# Patient Record
Sex: Female | Born: 1980 | Race: Black or African American | Hispanic: No | Marital: Single | Smoking: Never smoker
Health system: Southern US, Community
[De-identification: ages and names within clinical notes are randomized; demographics above are authoritative.]

## PROBLEM LIST (undated history)

## (undated) DIAGNOSIS — E079 Disorder of thyroid, unspecified: Secondary | ICD-10-CM

## (undated) HISTORY — PX: APPENDECTOMY: SHX54

## (undated) HISTORY — PX: BREAST SURGERY: SHX581

## (undated) HISTORY — PX: TONSILLECTOMY: SUR1361

## (undated) HISTORY — PX: CHOLECYSTECTOMY: SHX55

---

## 2006-05-30 ENCOUNTER — Encounter: Admission: RE | Admit: 2006-05-30 | Discharge: 2006-05-30 | Payer: Self-pay | Admitting: Occupational Medicine

## 2013-10-07 ENCOUNTER — Other Ambulatory Visit: Payer: Self-pay | Admitting: Endocrinology

## 2013-10-07 DIAGNOSIS — E041 Nontoxic single thyroid nodule: Secondary | ICD-10-CM

## 2013-10-14 ENCOUNTER — Ambulatory Visit
Admission: RE | Admit: 2013-10-14 | Discharge: 2013-10-14 | Disposition: A | Discharge status: Home / Self Care | Payer: Medicaid Other | Source: Ambulatory Visit | Attending: Endocrinology | Admitting: Endocrinology

## 2013-10-14 ENCOUNTER — Other Ambulatory Visit (HOSPITAL_COMMUNITY)
Admission: RE | Admit: 2013-10-14 | Discharge: 2013-10-14 | Disposition: A | Discharge status: Home / Self Care | Payer: Medicaid Other | Source: Ambulatory Visit | Attending: Interventional Radiology | Admitting: Interventional Radiology

## 2013-10-14 DIAGNOSIS — E041 Nontoxic single thyroid nodule: Secondary | ICD-10-CM

## 2014-01-21 ENCOUNTER — Emergency Department
Admission: EM | Admit: 2014-01-21 | Discharge: 2014-01-21 | Disposition: A | Discharge status: Home / Self Care | Payer: Medicaid Other | Source: Home / Self Care | Attending: Family Medicine | Admitting: Family Medicine

## 2014-01-21 ENCOUNTER — Encounter: Payer: Self-pay | Admitting: Emergency Medicine

## 2014-01-21 DIAGNOSIS — R300 Dysuria: Secondary | ICD-10-CM

## 2014-01-21 LAB — POCT URINALYSIS DIP (MANUAL ENTRY)
Bilirubin, UA: NEGATIVE
GLUCOSE UA: NEGATIVE
Ketones, POC UA: NEGATIVE
Leukocytes, UA: NEGATIVE
NITRITE UA: NEGATIVE
Protein Ur, POC: NEGATIVE
RBC UA: NEGATIVE
Spec Grav, UA: 1.025 (ref 1.005–1.03)
UROBILINOGEN UA: 0.2 (ref 0–1)
pH, UA: 7 (ref 5–8)

## 2014-01-21 MED ORDER — CEPHALEXIN 500 MG PO CAPS: 500.0000 mg | ORAL_CAPSULE | Freq: Three times a day (TID) | ORAL | Status: DC

## 2014-01-21 MED ORDER — FLUCONAZOLE 150 MG PO TABS: 150.0000 mg | ORAL_TABLET | Freq: Once | ORAL | Status: DC

## 2014-01-21 NOTE — ED Provider Notes (Signed)
CSN: [id number]     Arrival date & time 01/21/14  1411 History   None    Chief Complaint  Patient presents with  . Dysuria      HPI DYSURIA Onset:  1-2 days  Description: increased urinary frequency, bladder pressure  Modifying factors: pt states that she holds her urine at night.   Symptoms Urgency:  yes Frequency: yes  Hesitancy:  yes Hematuria:  no Flank Pain:  no Fever: no Nausea/Vomiting:  no Missed LMP: no STD exposure: no Discharge: no Irritants: no Rash: no  Red Flags   More than 3 UTI's last 12 months:  no PMH of  Diabetes or Immunosuppression:  no Renal Disease/Calculi: no Urinary Tract Abnormality:  no Instrumentation or Trauma: no      History reviewed. No pertinent past medical history. Past Surgical History  Procedure Laterality Date  . Cholecystectomy    . Tonsillectomy    . Appendectomy    . Cesarean section     Family History  Problem Relation Age of Onset  . Diabetes Mother   . Hypertension Mother   . Diabetes Father   . Hypertension Father   . Heart disease Father    History  Substance Use Topics  . Smoking status: Never Smoker   . Smokeless tobacco: Not on file  . Alcohol Use: No   OB History   Grav Para Term Preterm Abortions TAB SAB Ect Mult Living                 Review of Systems  All other systems reviewed and are negative.      Allergies  Review of patient's allergies indicates not on file.  Home Medications   Current Outpatient Rx  Name  Route  Sig  Dispense  Refill  . norethindrone-ethinyl estradiol (JUNEL FE,GILDESS FE,LOESTRIN FE) 1-20 MG-MCG tablet   Oral   Take 1 tablet by mouth daily.         . cephALEXin (KEFLEX) 500 MG capsule   Oral   Take 1 capsule (500 mg total) by mouth 3 (three) times daily.   21 capsule   0   . fluconazole (DIFLUCAN) 150 MG tablet   Oral   Take 1 tablet (150 mg total) by mouth once. Repeat if needed   2 tablet   0    BP 113/79  Pulse 66  Temp(Src) 97.8 F  (36.6 C) (Oral)  Ht 5\' 9"  (1.753 m)  Wt 229 lb (103.874 kg)  BMI 33.80 kg/m2  SpO2 99% Physical Exam  Constitutional: She is oriented to person, place, and time. She appears well-developed and well-nourished.  HENT:  Head: Normocephalic and atraumatic.  Eyes: Conjunctivae are normal. Pupils are equal, round, and reactive to light.  Neck: Normal range of motion. Neck supple.  Cardiovascular: Normal rate and regular rhythm.   Pulmonary/Chest: Effort normal.  Abdominal: Soft. Bowel sounds are normal.  No flank pain  Minimal suprapubic tenderness    Musculoskeletal: Normal range of motion.  Neurological: She is alert and oriented to person, place, and time.  Skin: Skin is warm.    ED Course  Procedures (including critical care time) Labs Review Labs Reviewed  URINE CULTURE  POCT URINALYSIS DIP (MANUAL ENTRY)   Imaging Review No results found.    MDM   Final diagnoses:  Dysuria   History consistent with UTI  Will place on keflex.  Urine culture.  Discussed not holding urine.  Discussed GU red flags.  Follow up as  needed.     The patient and/or caregiver has been counseled thoroughly with regard to treatment plan and/or medications prescribed including dosage, schedule, interactions, rationale for use, and possible side effects and they verbalize understanding. Diagnoses and expected course of recovery discussed and will return if not improved as expected or if the condition worsens. Patient and/or caregiver verbalized understanding.         Doree AlbeeSteven Jakyle Petrucelli, MD 01/21/14 1444

## 2014-01-21 NOTE — ED Notes (Signed)
Dysuria since this morning

## 2014-01-22 LAB — URINE CULTURE
Colony Count: NO GROWTH
ORGANISM ID, BACTERIA: NO GROWTH

## 2014-01-23 ENCOUNTER — Telehealth: Payer: Self-pay | Admitting: Emergency Medicine

## 2014-06-12 IMAGING — US US THYROID BIOPSY
1 series · 13 of 25 positions shown · non-contrast
Comparison: Prior thyroid ultrasound 10/05/2013; prior nuclear
medicine thyroid scan 09/08/2008

CLINICAL DATA: 32-year-old female with thyroid nodule meeting
consensus criteria for ultrasound-guided fine needle aspiration
biopsy.

EXAM:
ULTRASOUND GUIDED NEEDLE ASPIRATE BIOPSY OF THE THYROID GLAND

[Series 1: us thyroid biopsy · 0.08mm/px · 37 acquisitions, 13 frames shown]
[im 1/37]
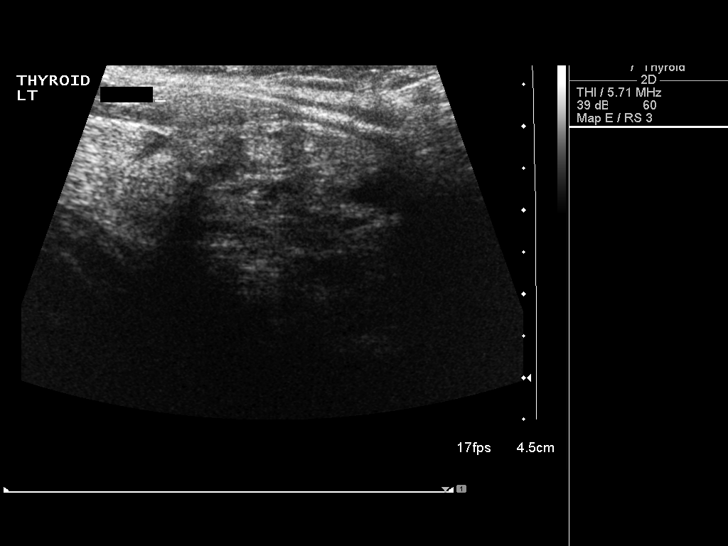
[im 4/37]
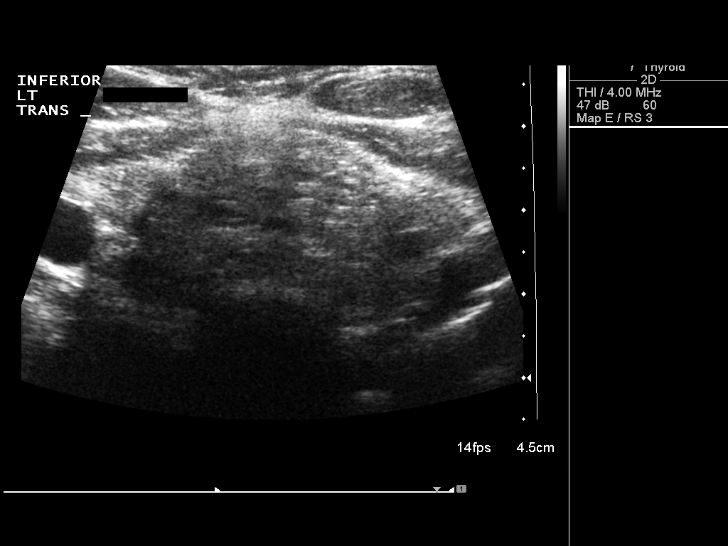
[im 7/37]
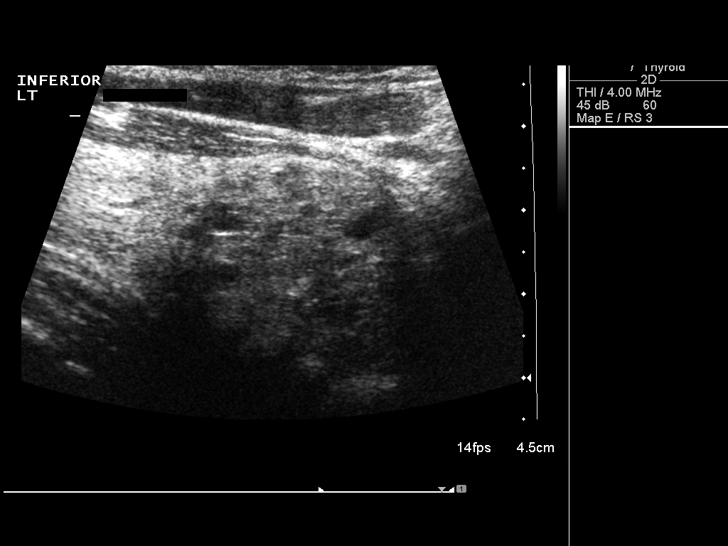
[im 10/37]
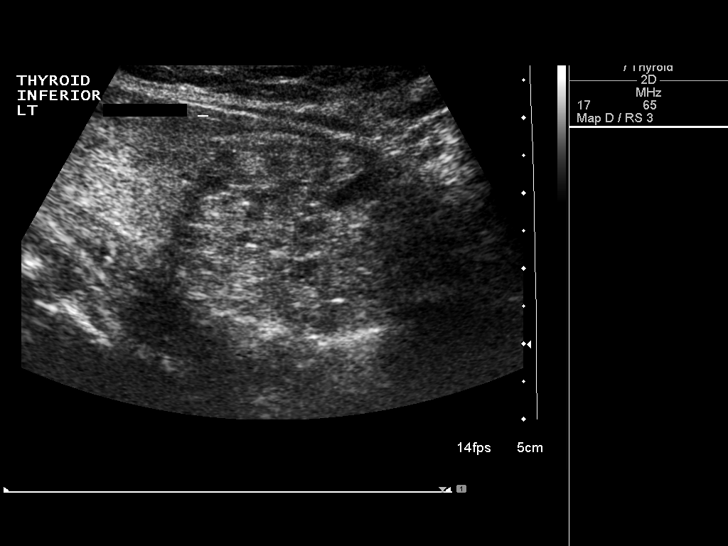
[im 13/37]
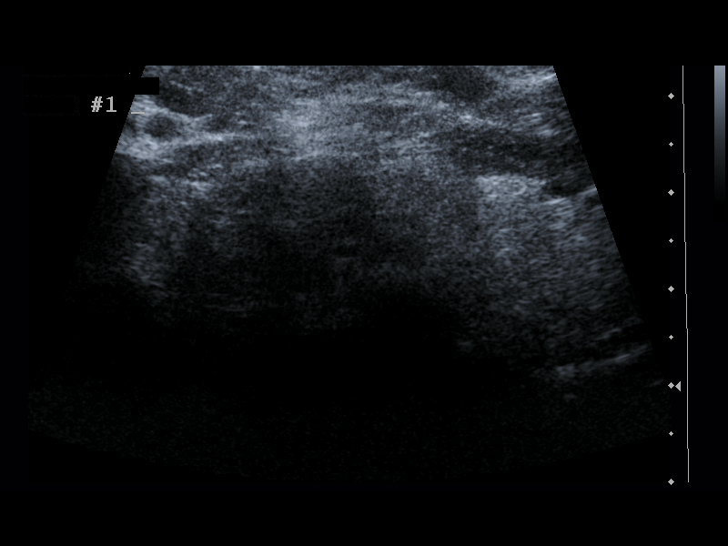
[im 16/37]
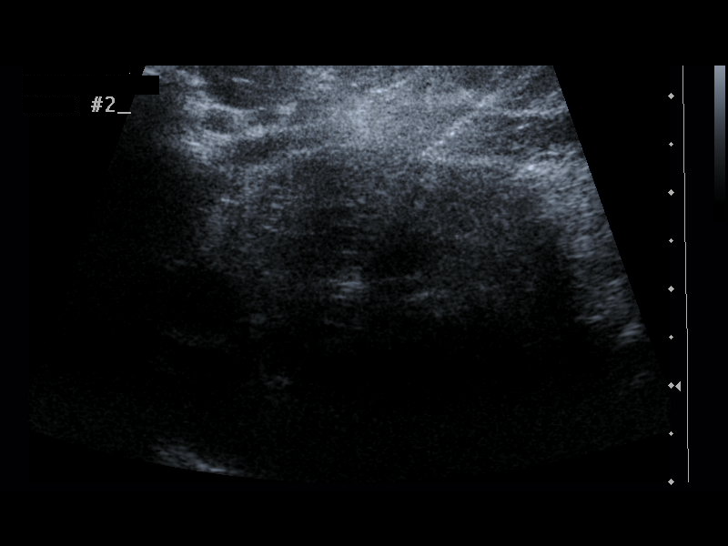
[im 19/37]
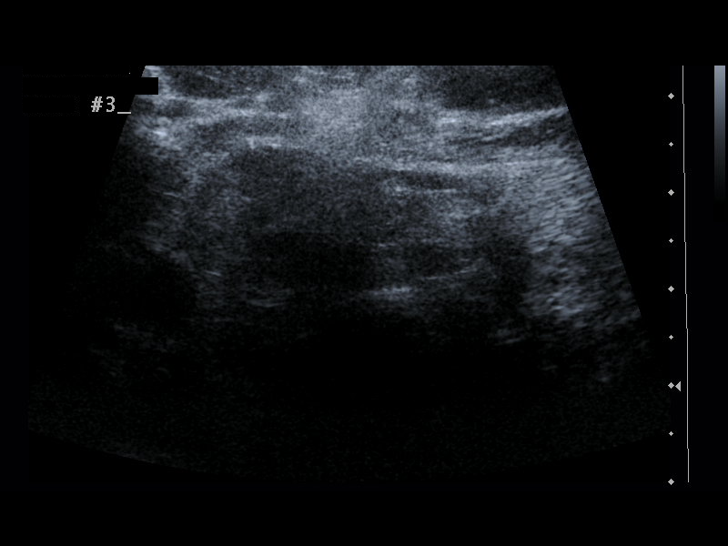
[im 22/37]
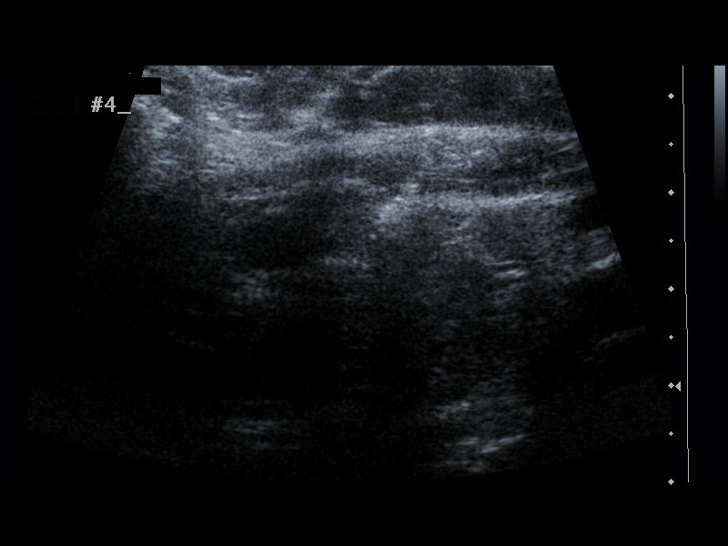
[im 25/37]
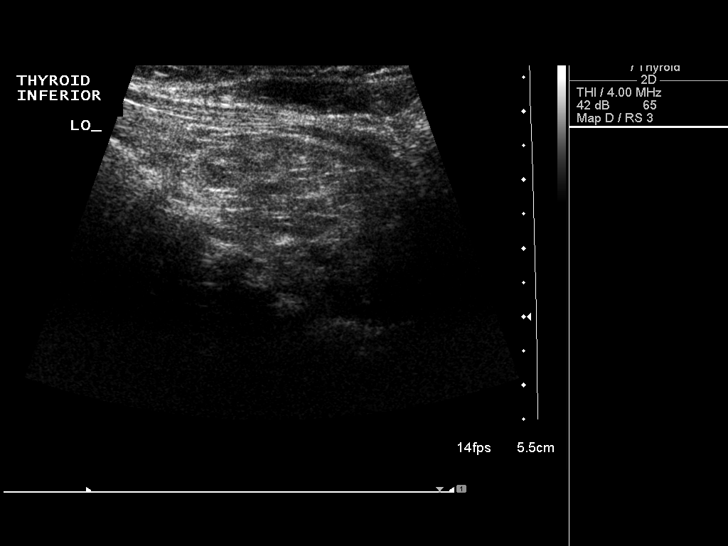
[im 28/37]
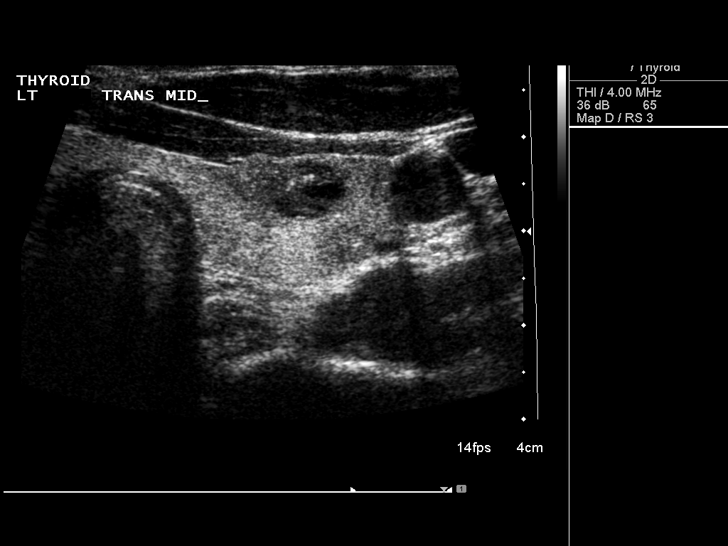
[im 31/37]
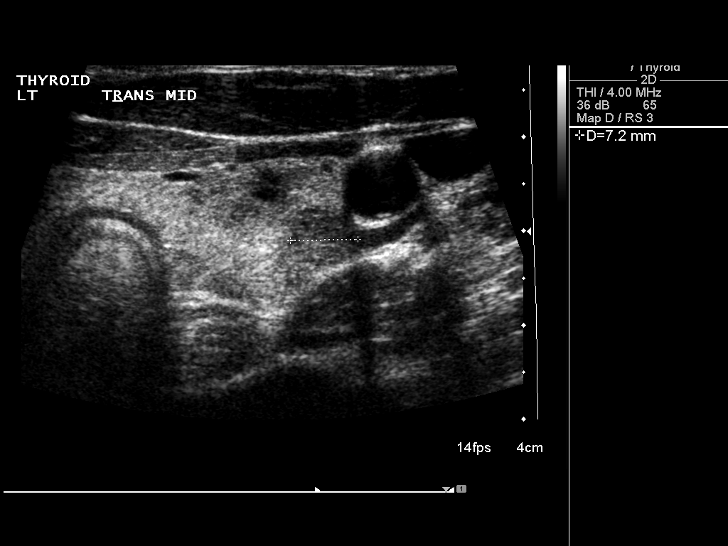
[im 34/37]
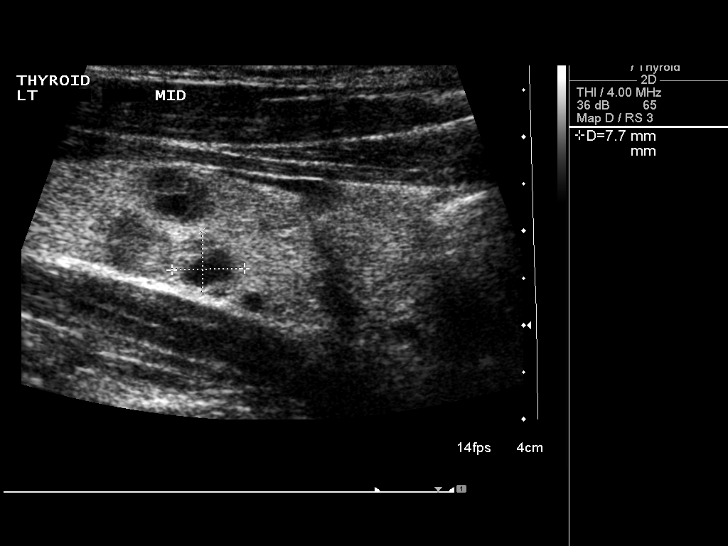
[im 37/37]
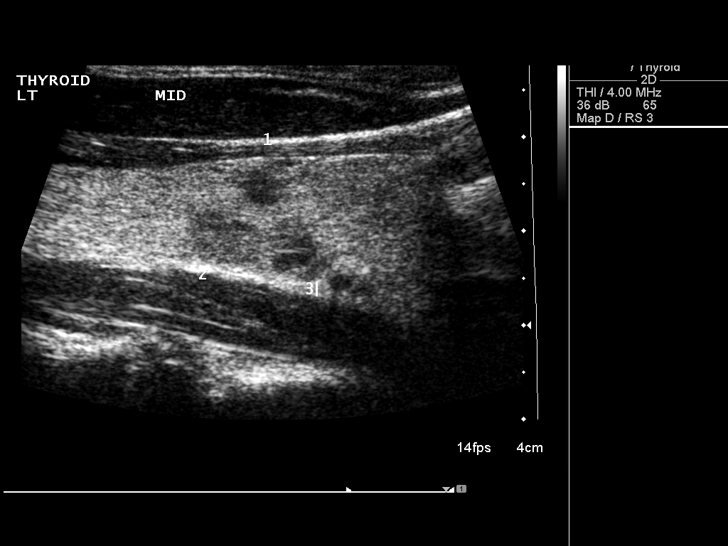

[13 of 25 positions shown; findings below may reference images not displayed]

FINDINGS: The previously measured 1.5 cm ovoid nodule in the left thyroid
gland actually represents 2 adjacent sub cm thyroid nodules which do
not currently meet consensus criteria for ultrasound-guided biopsy.
However, there is a large 3.6 x 2.6 x 4.3 cm predominantly solid
mass projecting exophytically from the inferomedial aspect of the
isthmus.
IMPRESSION: 1. The previously measured 1.5 cm ovoid nodule in the left thyroid
gland actually represents 2 adjacent sub cm thyroid nodules which do
not currently meet consensus criteria for ultrasound-guided biopsy.
However, there is a large 3.6 x 2.6 x 4.3 cm predominantly solid
mass projecting exophytically from the inferomedial aspect of the
isthmus.
2. Ultrasound-guided fine needle aspiration biopsy of 4.3 cm mass
exophytic from the inferior and medial aspect of the thyroid
isthmus.

PROCEDURE:
Thyroid biopsy was thoroughly discussed with the patient and
questions were answered. The benefits, risks, alternatives, and
complications were also discussed. The patient understands and
wishes to proceed with the procedure. Written consent was obtained.

Initial ultrasound interrogation of the left thyroid lobe
demonstrates what appears to be 3 small, separate thyroid nodules.
The lesion which was measured at 1.5 cm consistent with an enlarging
nodule on the prior thyroid scan actually represents 2 small
adjacent subcentimeter nodules. None of these nodules individually
meet consensus criteria for sonographic guided fine needle
aspiration biopsy. However, during interrogation of the left thyroid
gland, a large 3.6 x 2.6 x 4.3 cm predominantly solid mass was
identified projecting exophytically from the inferomedial aspect of
the thyroid isthmus. This nodule/ mass does meet consensus criteria
for biopsy.

Ultrasound was performed to localize and mark an adequate site for
the biopsy. The patient was then prepped and draped in a normal
sterile fashion. Local anesthesia was provided with 1% lidocaine.
Using direct ultrasound guidance, 4 passes were made using needles
into the nodule within the inferomedial isthmus of the thyroid.
Ultrasound was used to confirm needle placements on all occasions.
Specimens were sent to Pathology for analysis.

Complications:  None

## 2015-09-27 ENCOUNTER — Emergency Department
Admission: EM | Admit: 2015-09-27 | Discharge: 2015-09-27 | Disposition: A | Discharge status: Home / Self Care | Payer: Medicaid Other | Source: Home / Self Care | Attending: Family Medicine | Admitting: Family Medicine

## 2015-09-27 ENCOUNTER — Encounter: Payer: Self-pay | Admitting: *Deleted

## 2015-09-27 DIAGNOSIS — J040 Acute laryngitis: Secondary | ICD-10-CM

## 2015-09-27 DIAGNOSIS — J069 Acute upper respiratory infection, unspecified: Secondary | ICD-10-CM

## 2015-09-27 HISTORY — DX: Disorder of thyroid, unspecified: E07.9

## 2015-09-27 LAB — POCT RAPID STREP A (OFFICE): Rapid Strep A Screen: NEGATIVE

## 2015-09-27 MED ORDER — BENZONATATE 100 MG PO CAPS: 100.0000 mg | ORAL_CAPSULE | Freq: Three times a day (TID) | ORAL | Status: DC

## 2015-09-27 MED ORDER — SALINE SPRAY 0.65 % NA SOLN: 1.0000 | NASAL | Status: DC | PRN

## 2015-09-27 MED ORDER — HYDROCODONE-HOMATROPINE 5-1.5 MG/5ML PO SYRP: 5.0000 mL | ORAL_SOLUTION | Freq: Four times a day (QID) | ORAL | Status: DC | PRN

## 2015-09-27 MED ORDER — PSEUDOEPHEDRINE HCL 60 MG PO TABS: 60.0000 mg | ORAL_TABLET | Freq: Four times a day (QID) | ORAL | Status: DC | PRN

## 2015-09-27 NOTE — Discharge Instructions (Signed)
Hycodan is a narcotic pain and cough medication. While taking, do not drink alcohol, drive, or perform any other activities that requires focus while taking these medications.   You may take 400-600mg  Ibuprofen (Motrin) every 6-8 hours for fever and pain  Alternate with Tylenol  You may take 500mg  Tylenol every 4-6 hours as needed for fever and pain   Follow-up with your primary care provider in 7-10 days for recheck of symptoms if not improving.  Be sure to drink plenty of fluids and rest, at least 8hrs of sleep a night, preferably more while you are sick. Return urgent care or go to closest ER if you cannot keep down fluids/signs of dehydration, fever not reducing with Tylenol, difficulty breathing/wheezing, stiff neck, worsening condition, or other concerns (see below)

## 2015-09-27 NOTE — ED Notes (Signed)
Pt c/o cough, hoarseness, HA and runny nose x 1 day. Took Claritin and Mucinex.

## 2015-09-27 NOTE — ED Provider Notes (Signed)
CSN: [id number]     Arrival date & time 09/27/15  1224 History   First MD Initiated Contact with Patient 09/27/15 1236     Chief Complaint  Patient presents with  . Cough  . Hoarse   (Consider location/radiation/quality/duration/timing/severity/associated sxs/prior Treatment) HPI  Pt is a 34yo female presenting to [facility] with c/o dry intermittent mild to moderate cough, sore throat that is "scratchy" 9/10 at worst, rhinirrhea and generalized headache that started yesterday.  Pt took Claritin and Mucinex with no relief. States her son was sick this past weekend but is better now. Denies fever, chills, n/v/d. Denies recent travel. No hx of asthma. Pt requesting a flu test.   Past Medical History  Diagnosis Date  . Thyroid disease    Past Surgical History  Procedure Laterality Date  . Cholecystectomy    . Tonsillectomy    . Appendectomy    . Cesarean section    . Breast surgery     Family History  Problem Relation Age of Onset  . Diabetes Mother   . Hypertension Mother   . Diabetes Father   . Hypertension Father   . Heart disease Father   . Heart failure Father    Social History  Substance Use Topics  . Smoking status: Never Smoker   . Smokeless tobacco: None  . Alcohol Use: No   OB History    No data available     Review of Systems  Constitutional: Negative for fever and chills.  HENT: Positive for rhinorrhea, sore throat and voice change ( hoarse). Negative for congestion, ear pain and trouble swallowing.   Respiratory: Positive for cough. Negative for shortness of breath.   Cardiovascular: Negative for chest pain and palpitations.  Gastrointestinal: Negative for nausea, vomiting, abdominal pain and diarrhea.  Musculoskeletal: Negative for myalgias, back pain and arthralgias.  Skin: Negative for rash.  Neurological: Positive for headaches.  All other systems reviewed and are negative.   Allergies  Review of patient's allergies indicates no known allergies.  Home  Medications   Prior to Admission medications   Medication Sig Start Date End Date Taking? Authorizing Provider  benzonatate (TESSALON) 100 MG capsule Take 1 capsule (100 mg total) by mouth every 8 (eight) hours. 09/27/15   Junius FinnerErin O'Malley, PA-C  HYDROcodone-homatropine (HYCODAN) 5-1.5 MG/5ML syrup Take 5 mLs by mouth every 6 (six) hours as needed for cough. 09/27/15   Junius FinnerErin O'Malley, PA-C  norethindrone-ethinyl estradiol (JUNEL FE,GILDESS FE,LOESTRIN FE) 1-20 MG-MCG tablet Take 1 tablet by mouth daily.    Historical Provider, MD  pseudoephedrine (SUDAFED) 60 MG tablet Take 1 tablet (60 mg total) by mouth every 6 (six) hours as needed for congestion. 09/27/15   Junius FinnerErin O'Malley, PA-C  sodium chloride (OCEAN) 0.65 % SOLN nasal spray Place 1 spray into both nostrils as needed. 09/27/15   Junius FinnerErin O'Malley, PA-C   Meds Ordered and Administered this Visit  Medications - No data to display  BP 134/80 mmHg  Pulse 80  Temp(Src) 99.6 F (37.6 C) (Oral)  Resp 18  Ht 5' 9.5" (1.765 m)  Wt 258 lb (117.028 kg)  BMI 37.57 kg/m2  SpO2 100%  LMP 09/16/2015 No data found.   Physical Exam  Constitutional: She appears well-developed and well-nourished. No distress.  HENT:  Head: Normocephalic and atraumatic.  Right Ear: External ear normal.  Left Ear: External ear normal.  Nose: Nose normal.  Mouth/Throat: Uvula is midline and mucous membranes are normal. Posterior oropharyngeal edema ( mild) and posterior oropharyngeal erythema  present. No oropharyngeal exudate or tonsillar abscesses.  Eyes: Conjunctivae are normal. No scleral icterus.  Neck: Normal range of motion. Neck supple.  Hoarse voice, no stridor  Cardiovascular: Normal rate, regular rhythm and normal heart sounds.   Pulmonary/Chest: Effort normal and breath sounds normal. No respiratory distress. She has no wheezes. She has no rales. She exhibits no tenderness.  Abdominal: Soft. She exhibits no distension. There is no tenderness.   Musculoskeletal: Normal range of motion.  Neurological: She is alert.  Skin: Skin is warm and dry. She is not diaphoretic.  Nursing note and vitals reviewed.   ED Course  Procedures (including critical care time)  Labs Review Labs Reviewed  POCT RAPID STREP A (OFFICE)  POCT INFLUENZA A/B    Imaging Review No results found.     MDM   1. Acute upper respiratory infection   2. Laryngitis    Pt c/o cough, sore throat, and hoarse voice for 24 hours.   Rapid strep and Flu: negative Symptoms likely viral in nature. Rx: hycodan, tessalon, sudafed, and saline nasal spray. Advised pt to use acetaminophen and ibuprofen as needed for fever and pain. Encouraged rest and fluids. F/u with PCP in 7-10 days if not improving, sooner if worsening. Pt verbalized understanding and agreement with tx plan.     Junius Finner, PA-C 09/27/15 1312

## 2015-09-30 LAB — POCT INFLUENZA A/B
Influenza A, POC: NEGATIVE
Influenza B, POC: NEGATIVE

## 2015-10-01 ENCOUNTER — Telehealth: Payer: Self-pay | Admitting: Emergency Medicine

## 2016-11-19 ENCOUNTER — Ambulatory Visit (INDEPENDENT_AMBULATORY_CARE_PROVIDER_SITE_OTHER): Payer: Self-pay | Admitting: Orthopaedic Surgery

## 2016-11-28 ENCOUNTER — Ambulatory Visit (INDEPENDENT_AMBULATORY_CARE_PROVIDER_SITE_OTHER): Payer: Self-pay | Admitting: Orthopedic Surgery

## 2016-12-05 ENCOUNTER — Ambulatory Visit (INDEPENDENT_AMBULATORY_CARE_PROVIDER_SITE_OTHER): Payer: Self-pay | Admitting: Orthopedic Surgery

## 2017-08-27 ENCOUNTER — Ambulatory Visit (HOSPITAL_COMMUNITY): Payer: Medicaid Other | Admitting: Psychiatry

## 2019-02-23 ENCOUNTER — Other Ambulatory Visit: Payer: Self-pay

## 2019-02-23 ENCOUNTER — Encounter (INDEPENDENT_AMBULATORY_CARE_PROVIDER_SITE_OTHER): Payer: Self-pay | Admitting: Orthopaedic Surgery

## 2019-02-23 ENCOUNTER — Ambulatory Visit (INDEPENDENT_AMBULATORY_CARE_PROVIDER_SITE_OTHER): Payer: Self-pay | Admitting: Orthopaedic Surgery

## 2019-02-23 ENCOUNTER — Ambulatory Visit (INDEPENDENT_AMBULATORY_CARE_PROVIDER_SITE_OTHER): Payer: BC Managed Care – PPO

## 2019-02-23 VITALS — BP 120/81 | HR 76 | Ht 69.0 in | Wt 240.0 lb

## 2019-02-23 DIAGNOSIS — M25552 Pain in left hip: Secondary | ICD-10-CM

## 2019-02-23 DIAGNOSIS — G8929 Other chronic pain: Secondary | ICD-10-CM | POA: Insufficient documentation

## 2019-02-23 DIAGNOSIS — M5442 Lumbago with sciatica, left side: Secondary | ICD-10-CM | POA: Diagnosis not present
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
## 2019-02-23 NOTE — Addendum Note (Signed)
Addended by: Wendi Maya on: 02/23/2019 12:59 PM   Modules accepted: Orders

## 2019-03-05 ENCOUNTER — Telehealth (INDEPENDENT_AMBULATORY_CARE_PROVIDER_SITE_OTHER): Payer: Self-pay | Admitting: Physical Medicine and Rehabilitation

## 2019-03-05 NOTE — Telephone Encounter (Signed)
Scheduled for Webex visit on 3/31 at 0845. Patient advised to download app.

## 2019-03-05 NOTE — Telephone Encounter (Signed)
Please tell her that I reviewed Dr. Hoy Register notes extensively and he had detailed her MRI report and that her symptoms have been going on chronically for 5 years.Please tell her that we are limiting appointments to folks that have severe problems that cannot be pushed off 2 to 3 weeks.  If she does not want a virtual visit then I am fine seeing her at a later date.  Alternatively we could schedule her for facet joint block which was suggested by Dr. Cleophas Dunker based on his opinion looking at the images and the fact that epidural injection performed by another physician was not beneficial and seem to make the symptoms worse.

## 2019-03-05 NOTE — Telephone Encounter (Signed)
Okay; thanks.

## 2019-03-10 ENCOUNTER — Ambulatory Visit (INDEPENDENT_AMBULATORY_CARE_PROVIDER_SITE_OTHER): Payer: BC Managed Care – PPO | Admitting: Physical Medicine and Rehabilitation

## 2019-03-10 ENCOUNTER — Encounter (INDEPENDENT_AMBULATORY_CARE_PROVIDER_SITE_OTHER): Payer: Self-pay | Admitting: Physical Medicine and Rehabilitation

## 2019-03-10 DIAGNOSIS — M5442 Lumbago with sciatica, left side: Secondary | ICD-10-CM

## 2019-03-10 DIAGNOSIS — M47816 Spondylosis without myelopathy or radiculopathy, lumbar region: Secondary | ICD-10-CM

## 2019-03-10 DIAGNOSIS — M5116 Intervertebral disc disorders with radiculopathy, lumbar region: Secondary | ICD-10-CM | POA: Diagnosis not present

## 2019-03-10 DIAGNOSIS — G8929 Other chronic pain: Secondary | ICD-10-CM

## 2019-03-10 NOTE — Progress Notes (Signed)
 .  Numeric Pain Rating Scale and Functional Assessment Average Pain 7   In the last MONTH (on 0-10 scale) has pain interfered with the following?  1. General activity like being  able to carry out your everyday physical activities such as walking, climbing stairs, carrying groceries, or moving a chair?  Rating(5)   

## 2019-03-10 NOTE — Progress Notes (Signed)
Virtual Visit via Video Note  I connected with Amanda Mosley on 03/10/19 at  8:45 AM EDT by a video enabled telemedicine application and verified that I am speaking with the correct person using two identifiers.   I discussed the limitations of evaluation and management by telemedicine and the availability of in person appointments. The patient expressed understanding and agreed to proceed.  History of Present Illness: The patient is a very pleasant 38 year old female who we saw today at the request of Dr. Norlene Campbell for another opinion on her back and lumbar spine and left hip and buttock issues.  Evidently I have seen her father in the past it helped quite a bit with an injection.  I did review 40 pages of notes from [facility] and Dr. Burt Knack as well as report of lumbar spine MRI from October 2019 and Dr. Kary Kos interpretation of the MRI.  We also obviously reviewed Dr. Hoy Register notes as well.  Patient tells me she has had ongoing pain for probably 4 years of back pain that she has worked through with exercises and therapy in the past.  Some medication management is helped.  She would have intermittent flareups.  She never had an MRI at that time.  She was seen in the [facility] spine center in the past for back pain.  She felt like at that time a lot of her back pain could have been related to having to children and epidural performed during the C-section.  She reports though that in early October without any specific incident she had increasing left buttock pain with referral down the posterior lateral back of the thigh to about the knee.  She does not really endorse much past the knee.  She did get some paresthesia in the left foot which was more of the lateral foot but that was only on occasion and nothing persistent.  She reported that really this pain was much more significant than the back pain.  She had no pain on the right side.  She was seen in an urgent care  and they did do a urgent MRI of the lumbar spine with suspected acute herniated disc.  She did get worsening symptoms with prolonged sitting more than standing and walking.  She reports being able to workout at the gym without much difficulty.  She does report though that lifting seems to increase the pain as does any sort of coughing or sneezing.  She has not noticed any focal weakness or foot drop.  Physical exam performed by both Dr. Val Eagle and Dr. Cleophas Dunker did not show any strength loss.  She ultimately had to epidural injections performed by Dr. Marcy Siren.  The patient thinks these were done with ultrasound.  I did discuss that I think it was probably done with fluoroscopy but we do not have those notes for review.  She does state that she did not feel any pain pattern with the injection itself down in the hip or leg.  Dr. Val Eagle suggested that since the injections did not help that he would recommend an L5-S1 microdiscectomy.  He placed her on Lyrica 75 mg twice per day.  She states that the last visit with him she was basically in tears because she did not want surgery and she felt like the Lyrica had been given to her just to get her through to surgery.  She had tried some hydrocodone which was not beneficial.  She reported gabapentin seem to be helping but  she did not like the side effect profile which she felt like gave her uncontrolled movements.  I did discuss with her that have not seen that was gabapentin but it sounds like maybe she titrated up the dose pretty quickly.  It looks like they had her upping the dose every 3 days to get to 300 mg 3 times a day which seems to be a little bit aggressive.   Observations/Objective: Patient shows me on the video camera that she can stand and walk without difficulty and she has no strength loss.  Prior notes reviewed show no strength loss.  She does get some pain with sitting and with Valsalva.  Assessment and Plan: Chronic back pain which I feel like  is related to facet arthropathy at L4-5 noted on the MRI.  She has this more severe onset of left S1 radiculitis radiculopathy with disc protrusion at L5-S1 at least by report may be more left-sided.  Epidural injection at this point was not beneficial but it is interesting she did not feel much in the way of the injection referral pattern which is usually the case if it is an S1 transforaminal injection.  As I discussed at length with her the next step is to get the MRI CD so I can actually see the images and also want to get the reports from Dr. Karie Georges and the patient will try to get these to [place].  Also undergoing schedule a left S1 transforaminal epidural injection just 1 time to see if this would help.  If anything reviewed on the MRI or reports change my mind we will discuss that with her.  I also went over at length the use of Lyrica and I do want her to start the Lyrica.  She did not start this at the time just because she really just did not understand the use of it and was very frustrated with the surgeon.  We discussed the risks benefits of the Lyrica and she is going to start that at 75 mg at night for 7 days and then twice per day.  She can call us with any questions.  I also want her to look up neural flossing and start doing that as well.  Follow Up Instructions:    I discussed the assessment and treatment plan with the patient. The patient was provided an opportunity to ask questions and all were answered. The patient agreed with the plan and demonstrated an understanding of the instructions.   The patient was advised to call back or seek an in-person evaluation if the symptoms worsen or if the condition fails to improve as anticipated.  I provided 30 minutes of non-face-to-face time during this encounter.   Naaman Plummer, MD

## 2019-06-04 HISTORY — PX: MICRODISCECTOMY LUMBAR: SUR864

## 2021-01-06 ENCOUNTER — Telehealth: Payer: Self-pay | Admitting: Physical Medicine and Rehabilitation

## 2021-01-06 NOTE — Telephone Encounter (Signed)
Pt called and she needs to come in and see Dr.Newton for her back pain. CB [phone]

## 2021-01-09 NOTE — Telephone Encounter (Signed)
Called pt and sch 2/15 for OV -- Lower back pain

## 2021-01-24 ENCOUNTER — Encounter: Payer: Self-pay | Admitting: Physical Medicine and Rehabilitation

## 2021-01-24 ENCOUNTER — Ambulatory Visit: Payer: BC Managed Care – PPO | Admitting: Physical Medicine and Rehabilitation

## 2021-01-24 ENCOUNTER — Other Ambulatory Visit: Payer: Self-pay

## 2021-01-24 DIAGNOSIS — G8929 Other chronic pain: Secondary | ICD-10-CM

## 2021-01-24 DIAGNOSIS — M5116 Intervertebral disc disorders with radiculopathy, lumbar region: Secondary | ICD-10-CM

## 2021-01-24 DIAGNOSIS — M5442 Lumbago with sciatica, left side: Secondary | ICD-10-CM | POA: Diagnosis not present

## 2021-01-24 DIAGNOSIS — M47816 Spondylosis without myelopathy or radiculopathy, lumbar region: Secondary | ICD-10-CM

## 2021-01-24 MED ORDER — METHOCARBAMOL 500 MG PO TABS: 500.0000 mg | ORAL_TABLET | Freq: Three times a day (TID) | ORAL | 0 refills | Status: AC | PRN

## 2021-01-24 NOTE — Progress Notes (Signed)
Amanda Mosley - 40 y.o. female MRN [id number]  Date of birth: [birth year]  Office Visit Note: Visit Date: 01/24/2021 PCP: [place], Julie M, NP Referred by: [place], Julie M, NP  Subjective: Chief Complaint  Patient presents with  . Lower Back - Pain    Had surgery in 2020 and still not getting anywhere with it, sharp pain in left Sciatic area mainly on left occasionally on Right. Onset x years, NKI.Unable to get out of bed like a normal person, really bad. Did a video visit and she brought all her records in back then and cds of her scans.   HPI: Amanda Mosley is a 40 y.o. female who comes in today For evaluation and management of chronic recalcitrant low back pain with left hip pain and occasionally right hip pain.  By brief review she was initially seen in our office by Dr. Norlene Campbell.  Dr. Cleophas Dunker is also followed her father and evidently at some point I had injected her father as well.  I initially saw her in a video visit do to the coronavirus pandemic.  Those notes are in the chart and can be fully reviewed.  Since I saw her she is actually undergone microdiscectomy by Dr. Loreta Ave and this was at L5-S1.  MRI report from that time reviewed again with her today and reviewed below the notes.  She did have disc protrusion herniation on the left at L5-S1.  She had moderate facet arthritis at L4-5 and degenerative changes more than it expect for her age.  No high-grade stenosis.  She reports continued conservative care with medication management including anti-inflammatory at times and Tylenol as well as methocarbamol.  She is no longer taking Lyrica.  From an intervention standpoint her case is complicated by allergy to both iodinated contrast and gadolinium.  Her case is further complicated by obesity.  She reports some days 8 out of 10 pain but a constant 4 out of 10 back pain mostly centralized referral into the left posterior lateral hip.  No groin pain no recent trauma or other inciting pain  just continued pain.  This pain has been ongoing for years and now she is status post lumbar laminectomy discectomy without relief.  She gets sharp pain in the left sciatic notch and some referral posterior laterally more of an S1 distribution.  She feels like she has difficulty getting in and out of bed.  It is worse with standing worse with sitting and activity.  No focal weakness or bowel or bladder changes.  Review of Systems  Musculoskeletal: Positive for back pain and joint pain.  All other systems reviewed and are negative.  Otherwise per HPI.  Assessment & Plan: Visit Diagnoses:    ICD-10-CM   1. Intervertebral disc disorders with radiculopathy, lumbar region  M51.16   2. Spondylosis without myelopathy or radiculopathy, lumbar region  M47.816   3. Chronic left-sided low back pain with left-sided sciatica  M54.42    G89.29      Plan: Findings:  Chronic recalcitrant low back pain with left radicular leg pain now status post lumbar discectomy laminectomy in 2020 with continued recalcitrant pain.  Lumbar spine consistent with lumbar facet arthropathy that could be giving her back pain also with potential for continued radiculitis radiculopathy status post decompression.  Even with decompression sometimes these nerves still can be pain generators even though the "" pressure and area around it is decompressed.  We a long discussion with her today about medications and  activity.  I want her to continue with home exercise and we did give her some ideas about neural flossing and other exercises.  I refilled her methocarbamol.  We talked about the fact that chronic opioid treatment is probably not a good option and is not something she really wants to look at anyway.  I think doing a S1 transforaminal injection versus diagnostic medial branch blocks would be the next step.  We can still do the transforaminal injection without contrast his longus position looked well.  I would only do this at the lower  levels.  She also may be a candidate for spinal cord stimulator trial.    Meds & Orders:  Meds ordered this encounter  Medications  . methocarbamol (ROBAXIN) 500 MG tablet    Sig: Take 1 tablet (500 mg total) by mouth every 8 (eight) hours as needed for muscle spasms.    Dispense:  60 tablet    Refill:  0   No orders of the defined types were placed in this encounter.   Follow-up: No follow-ups on file.   Procedures: No procedures performed      Clinical History: 10/06/2018  MRI lumbar spine:  INDICATION: Back pain and left thigh pain  TECHNIQUE: Sagittal and axial T1 and T2-weighted sequences were performed. Additional sagittal STIR images were performed.  COMPARISON: None available  FINDINGS: #  Vertebral bodies: No compression fracture. #  Alignment: Normal. #  Marrow signal: No significant abnormality. #  Conus medullaris: Normal. Terminates at L1-L2 with no evidence of tethering. #  Lower thoracic segments: No significant abnormality.  #  L1-2: Normal. #  L2-3: Normal. #  L3-4: Moderate facet joint arthritis. No spinal or foraminal stenosis. #  L4-5: Mild degenerative disc disease. Central disc protrusion extends 4 mm beyond the expected posterior margin of the disc. Moderate facet joint arthritis. Protrusion with mild impingement on the thecal sac. #  L5-S1: Mild degenerative disc disease. Central disc protrusion with moderate impingement on the thecal sac. This disc protrusion extends 5 mm beyond the expected posterior margin of the disc. Mild facet joint arthritis. Overall, this results in mild  stenosis of the spinal canal.   IMPRESSION: Central disc protrusions L4-5 and L5-S1 with mild impingement on the thecal sac at both levels. Mild spinal stenosis L5-S1.   She reports that she has never smoked. She has never used smokeless tobacco. No results for input(s): HGBA1C, LABURIC in the last 8760 hours.  Objective:  VS:  HT:    WT:   BMI:     BP:   HR: bpm   TEMP: ( )  RESP:  Physical Exam Vitals and nursing note reviewed.  Constitutional:      General: She is not in acute distress.    Appearance: Normal appearance. She is obese. She is not ill-appearing.  HENT:     Head: Normocephalic and atraumatic.     Right Ear: External ear normal.     Left Ear: External ear normal.  Eyes:     Extraocular Movements: Extraocular movements intact.  Cardiovascular:     Rate and Rhythm: Normal rate.     Pulses: Normal pulses.  Pulmonary:     Effort: Pulmonary effort is normal. No respiratory distress.  Abdominal:     General: There is no distension.     Palpations: Abdomen is soft.  Musculoskeletal:        General: Tenderness present.     Cervical back: Neck supple.  Right lower leg: No edema.     Left lower leg: No edema.     Comments: Patient has good distal strength with no pain over the greater trochanters.  No clonus or focal weakness.  She is tender across the lower back and left more than right PSIS.  No pain with hip rotation.  She has pain with facet joint extension and loading.  She has dysesthesias in S1 dermatome on the left.  Skin:    Findings: No erythema, lesion or rash.  Neurological:     General: No focal deficit present.     Mental Status: She is alert and oriented to person, place, and time.     Cranial Nerves: No cranial nerve deficit.     Sensory: No sensory deficit.     Motor: No weakness or abnormal muscle tone.     Coordination: Coordination normal.     Gait: Gait abnormal.  Psychiatric:        Mood and Affect: Mood normal.        Behavior: Behavior normal.     Ortho Exam  Imaging: No results found.  Past Medical/Family/Surgical/Social History: Medications & Allergies reviewed per EMR, new medications updated. Patient Active Problem List   Diagnosis Date Noted  . Chronic left-sided low back pain with left-sided sciatica 02/23/2019  . Pain of left hip joint 02/23/2019   Past Medical History:  Diagnosis  Date  . Thyroid disease    Family History  Problem Relation Age of Onset  . Diabetes Mother   . Hypertension Mother   . Diabetes Father   . Hypertension Father   . Heart disease Father   . Heart failure Father    Past Surgical History:  Procedure Laterality Date  . APPENDECTOMY    . BREAST SURGERY    . CESAREAN SECTION    . CHOLECYSTECTOMY    . MICRODISCECTOMY LUMBAR Left 06/04/2019   Margy Clarks, MD [facility]  . TONSILLECTOMY     Social History   Occupational History  . Not on file  Tobacco Use  . Smoking status: Never Smoker  . Smokeless tobacco: Never Used  Substance and Sexual Activity  . Alcohol use: No  . Drug use: No  . Sexual activity: Not on file

## 2021-02-14 ENCOUNTER — Telehealth: Payer: Self-pay | Admitting: Physical Medicine and Rehabilitation

## 2021-02-14 NOTE — Telephone Encounter (Signed)
Pt needs to reschedule her appt currently set for 02/15/21  [phone]

## 2021-02-14 NOTE — Telephone Encounter (Signed)
Rescheduled

## 2021-02-15 ENCOUNTER — Ambulatory Visit: Payer: BC Managed Care – PPO | Admitting: Physical Medicine and Rehabilitation

## 2021-03-07 ENCOUNTER — Ambulatory Visit: Payer: BC Managed Care – PPO | Admitting: Physical Medicine and Rehabilitation

## 2021-03-07 ENCOUNTER — Telehealth: Payer: Self-pay | Admitting: Physical Medicine and Rehabilitation

## 2021-03-07 NOTE — Telephone Encounter (Signed)
Patient rescheduled

## 2021-03-07 NOTE — Telephone Encounter (Signed)
Pt called stating she has an appt @ 9:30 this morning but she lives in [place] and be late, so she would like a CB to know if she gets here a little past her grace period can she still be seen?

## 2021-03-22 ENCOUNTER — Ambulatory Visit: Payer: BC Managed Care – PPO | Admitting: Physical Medicine and Rehabilitation

## 2021-04-12 ENCOUNTER — Ambulatory Visit: Payer: BC Managed Care – PPO | Admitting: Physical Medicine and Rehabilitation

## 2021-04-12 ENCOUNTER — Telehealth: Payer: Self-pay | Admitting: Physical Medicine and Rehabilitation

## 2021-04-12 ENCOUNTER — Encounter: Payer: Self-pay | Admitting: Physical Medicine and Rehabilitation

## 2021-04-12 NOTE — Telephone Encounter (Signed)
Patient requesting a call back. Please call patient at [phone]. Patient did not disclose the reasoning for her call. Please call patient about this matter.

## 2021-04-12 NOTE — Telephone Encounter (Signed)
Called pt and r/s 

## 2021-04-25 ENCOUNTER — Encounter: Payer: Self-pay | Admitting: Physical Medicine and Rehabilitation

## 2021-04-25 ENCOUNTER — Other Ambulatory Visit: Payer: Self-pay

## 2021-04-25 ENCOUNTER — Ambulatory Visit: Payer: BC Managed Care – PPO | Admitting: Physical Medicine and Rehabilitation

## 2021-04-25 VITALS — BP 113/80 | HR 78

## 2021-04-25 DIAGNOSIS — G8929 Other chronic pain: Secondary | ICD-10-CM

## 2021-04-25 DIAGNOSIS — M961 Postlaminectomy syndrome, not elsewhere classified: Secondary | ICD-10-CM

## 2021-04-25 DIAGNOSIS — M545 Low back pain, unspecified: Secondary | ICD-10-CM | POA: Diagnosis not present

## 2021-04-25 DIAGNOSIS — M5416 Radiculopathy, lumbar region: Secondary | ICD-10-CM | POA: Diagnosis not present

## 2021-04-25 DIAGNOSIS — M5116 Intervertebral disc disorders with radiculopathy, lumbar region: Secondary | ICD-10-CM | POA: Diagnosis not present

## 2021-04-25 DIAGNOSIS — F411 Generalized anxiety disorder: Secondary | ICD-10-CM

## 2021-04-25 NOTE — Progress Notes (Signed)
[facility] Kathryne Sharper,.Pt state Lower back pain mostly on the right side and buttocks area. Pt state laying down at night makes the pain worse. Pt state she doesn't take anything for the pain.  Numeric Pain Rating Scale and Functional Assessment Average Pain 9 Pain Right Now 9 My pain is constant, sharp and stabbing Pain is worse with: some activites Pain improves with: rest   In the last MONTH (on 0-10 scale) has pain interfered with the following?  1. General activity like being  able to carry out your everyday physical activities such as walking, climbing stairs, carrying groceries, or moving a chair?  Rating(9)  2. Relation with others like being able to carry out your usual social activities and roles such as  activities at home, at work and in your community. Rating(8)  3. Enjoyment of life such that you have  been bothered by emotional problems such as feeling anxious, depressed or irritable?  Rating(8)

## 2021-04-26 ENCOUNTER — Encounter: Payer: Self-pay | Admitting: Physical Medicine and Rehabilitation

## 2021-04-26 DIAGNOSIS — M961 Postlaminectomy syndrome, not elsewhere classified: Secondary | ICD-10-CM | POA: Insufficient documentation

## 2021-04-26 DIAGNOSIS — M5416 Radiculopathy, lumbar region: Secondary | ICD-10-CM | POA: Insufficient documentation

## 2021-04-26 DIAGNOSIS — G8929 Other chronic pain: Secondary | ICD-10-CM | POA: Insufficient documentation

## 2021-04-26 DIAGNOSIS — M545 Low back pain, unspecified: Secondary | ICD-10-CM | POA: Insufficient documentation

## 2021-04-26 MED ORDER — DIAZEPAM 5 MG PO TABS: ORAL_TABLET | ORAL | 0 refills | Status: AC

## 2021-04-26 NOTE — Progress Notes (Signed)
NYSA SARIN - 40 y.o. female MRN [id number]  Date of birth: 1981/06/19  Office Visit Note: Visit Date: 04/25/2021 PCP: [place], Julie M, NP Referred by: [place], Julie M, NP  Subjective: Chief Complaint  Patient presents with  . Lower Back - Pain   HPI: Amanda Mosley is a 40 y.o. female who comes in today For evaluation and management of chronic very severe low back pain now with right buttock pain with history of left radicular pain status post lumbar microdiscectomy in 2020 by Dr. Margy Clarks at [facility].  Her brief history is initially met with her as a telemedicine visit because I had seen her father at one time.  At that visit she was having left radicular leg complaints but went on to have lumbar microdiscectomy before I ever saw her again.  She reported some initial relief after the surgery and then increasing pain.  The last time I saw her she was having this left radicular type pain with paresthesia.  She began physical therapy at [facility] and during those sessions began having right buttock pain but the left-sided leg pain and paresthesia has resolved at this point.  She however rates the right buttock pain as a 9 out of 10 and limits what she can do daily.  There seems to be some underlying pain syndrome without diagnosis of fibromyalgia at this point.  She does have some anxiety at least clinically from what I can tell but she is not really treated or diagnosed with generalized anxiety disorder or any other features like that.  She is intolerant of some medications that she has tried.  We did try her on methocarbamol and she said it gave her significant headache.  She seems to have some effects of medications that we just are not typical.  She does carry a diagnosis of iodine contrast with hives but this was many years ago and some history of gadolinium allergy but she is unsure of what that was but it is on the chart.  She has had MRI since her lumbar surgery and this was not  done with contrast likely do to the fact that that she has a listed allergy.  Nonetheless the MRI from 2020 post surgery does show residual left protrusion versus scar tissue but again no contrast used.  Again now she has this right buttock pain it does not referral down the legs there is no paresthesia.  It is worse when she lays down at night.  The physical therapist did do some manual treatment but I am unsure if they did any dry needling.  When we mention dry needling she has disorder of exaggerated response even thinking about it.  It is a sharp stabbing pain now in this right buttock region.  No focal weakness no trauma etc.  MRI from 2020 does not show any specific right-sided compression or narrowing  Review of Systems  Constitutional: Positive for malaise/fatigue.  Musculoskeletal: Positive for back pain.       Buttock pain  All other systems reviewed and are negative.  Otherwise per HPI.  Assessment & Plan: Visit Diagnoses:    ICD-10-CM   1. Intervertebral disc disorders with radiculopathy, lumbar region  M51.16   2. Lumbar radiculopathy  M54.16   3. Post laminectomy syndrome  M96.1   4. Chronic right-sided low back pain without sciatica  M54.50    G89.29   5. Pre-operative anxiety  F41.1      Plan: Findings:  Chronic recalcitrant history of low back pain status post lumbar microdiscectomy for left radicular pain at L5-S1.  She had pain post surgery with MRI showing residual component of the surgery but has since gone now to have resolution of the left-sided pain post physical therapy and time.  She now has right-sided pain into the buttocks but without really referral down the leg but it does seem nerve related given the amount of pain that she is having.  Could entertain the idea that is a sacroiliac joint pain versus piriformis etc. but she has been through extensive physical therapy in [place], [place].  She feels like some of that actually exacerbated the pain.  She  could have new small disc herniation at that level more right-sided component.  No real red flag symptoms or signs good distal strength positive slump test.  At this point I think a right S1 transforaminal injection will go a long way diagnostically.  We would do that with a small amount of contrast.  She can take antihistamine before she comes in I think she will be okay with that.  Depending on relief of that injection with either update MRI of the lumbar spine versus trial of medication management.  She has done well with Lyrica in the past but this was discontinued by the spine surgeon after the surgery as he felt like she would do fine with the surgery which ultimately she has.  She will continue with weight loss management and she is on medication for that as well.    Meds & Orders:  Meds ordered this encounter  Medications  . diazepam (VALIUM) 5 MG tablet    Sig: Take 1 by mouth 1 hour  pre-procedure with very light food. May bring 2nd tablet to appointment.    Dispense:  2 tablet    Refill:  0   No orders of the defined types were placed in this encounter.   Follow-up: Return for Right S1 transforaminal epidural steroid injection.   Procedures: No procedures performed      Clinical History: Lucy Chris, MD - 06/07/2019  Formatting of this note might be different from the original.  MRI lumbar spine:   INDICATION: Back pain   TECHNIQUE: Sagittal and axial T1 and T2-weighted sequences were performed. Additional sagittal STIR images were performed.   COMPARISON: 10/06/2018   FINDINGS:   The patient is status post recent left L5 hemilaminectomy with postsurgical changes of the dorsal soft tissues including a small subcutaneous fluid collection which represent a hematoma measuring possibly 2.1 x 0.8 x 1.5 cm.   There is no acute fracture. There is no subluxation. There are diffuse degenerative changes with disc desiccation at the L4-5 and L5-S1 levels.   The T12-L1  through L2-3 levels are grossly unremarkable.   At the L3-4level there is no evidence of disc herniation or spinal canal stenosis. There is mild narrowing of the neural foramina by spondylitic change. There is fluid noted within the bilateral L3-4 facet joints.   At the L4-5 level there is a shallow central disc herniation with ligamental and facet joint hypertrophy with mild spinal canal stenosis and narrowing of the lateral recesses bilaterally. There is mild narrowing of the bilateral neural foramina by  spondylitic change.   At the L5-S1 level there is a left parasagittal soft tissue density indenting the ventral thecal sac which could represent postoperative changes versus residual disc herniation. There is no spinal canal stenosis. There is narrowing of the lateral  recesses left greater than right.There is mild narrowing of the bilateral neural foramina by spondylitic change.   There is no evidence to suggest epidural hematoma.   The visualized cauda equina demonstrates no evidence of arachnoiditis.    IMPRESSION:   Status post recent L5 left hemilaminectomy with postsurgical changes of the dorsal soft tissues.   No evidence of epidural hematoma. No evidence of arachnoiditis.   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   Left parasagittal soft tissue density at L5-S1 which may represent postoperative changes versus residual disc herniation without spinal canal stenosis.   Electronically Signed by: Erline Levine Exam End: 06/07/19 6:14 PM   She reports that she has never smoked. She has never used smokeless tobacco. No results for input(s): HGBA1C, LABURIC in the last 8760 hours.  Objective:  VS:  HT:    WT:   BMI:     BP:113/80  HR:78bpm  TEMP: ( )  RESP:  Physical Exam Vitals and nursing note reviewed.  Constitutional:      General: She is not in acute distress.     Appearance: Normal appearance. She is obese. She is not ill-appearing.  HENT:     Head: Normocephalic and atraumatic.     Right Ear: External ear normal.     Left Ear: External ear normal.  Eyes:     Extraocular Movements: Extraocular movements intact.  Cardiovascular:     Rate and Rhythm: Normal rate.     Pulses: Normal pulses.  Pulmonary:     Effort: Pulmonary effort is normal. No respiratory distress.  Abdominal:     General: There is no distension.     Palpations: Abdomen is soft.  Musculoskeletal:        General: Tenderness present.     Cervical back: Neck supple.     Right lower leg: No edema.     Left lower leg: No edema.     Comments: Patient has good distal strength with no pain over the greater trochanters.  No clonus or focal weakness.  She has pain to deep palpation of the right PSIS.  She has a positive Fortin finger sign.  Equivocal Patrick's testing.  Positive slump test.  Skin:    Findings: No erythema, lesion or rash.  Neurological:     General: No focal deficit present.     Mental Status: She is alert and oriented to person, place, and time.     Sensory: No sensory deficit.     Motor: No weakness or abnormal muscle tone.     Coordination: Coordination normal.  Psychiatric:        Mood and Affect: Mood normal.        Behavior: Behavior normal.     Ortho Exam  Imaging: No results found.  Past Medical/Family/Surgical/Social History: Medications & Allergies reviewed per EMR, new medications updated. Patient Active Problem List   Diagnosis Date Noted  . Post laminectomy syndrome 04/26/2021  . Lumbar radiculopathy 04/26/2021  . Chronic right-sided low back pain without sciatica 04/26/2021  . Chronic left-sided low back pain with left-sided sciatica 02/23/2019  . Pain of left hip joint 02/23/2019   Past Medical History:  Diagnosis Date  . Thyroid disease    Family History  Problem Relation Age of Onset  . Diabetes Mother   . Hypertension Mother   .  Diabetes Father   . Hypertension Father   . Heart  disease Father   . Heart failure Father    Past Surgical History:  Procedure Laterality Date  . APPENDECTOMY    . BREAST SURGERY    . CESAREAN SECTION    . CHOLECYSTECTOMY    . MICRODISCECTOMY LUMBAR Left 06/04/2019   Earnstine Regal, MD [facility]  . TONSILLECTOMY     Social History   Occupational History  . Not on file  Tobacco Use  . Smoking status: Never Smoker  . Smokeless tobacco: Never Used  Substance and Sexual Activity  . Alcohol use: No  . Drug use: No  . Sexual activity: Not on file

## 2021-04-27 ENCOUNTER — Ambulatory Visit: Payer: BC Managed Care – PPO | Admitting: Physical Medicine and Rehabilitation

## 2021-05-04 ENCOUNTER — Ambulatory Visit: Payer: Self-pay

## 2021-05-04 ENCOUNTER — Other Ambulatory Visit: Payer: Self-pay

## 2021-05-04 ENCOUNTER — Ambulatory Visit (INDEPENDENT_AMBULATORY_CARE_PROVIDER_SITE_OTHER): Payer: BC Managed Care – PPO | Admitting: Physical Medicine and Rehabilitation

## 2021-05-04 ENCOUNTER — Encounter: Payer: Self-pay | Admitting: Physical Medicine and Rehabilitation

## 2021-05-04 VITALS — BP 119/87 | HR 79

## 2021-05-04 DIAGNOSIS — M5416 Radiculopathy, lumbar region: Secondary | ICD-10-CM

## 2021-05-04 MED ORDER — BETAMETHASONE SOD PHOS & ACET 6 (3-3) MG/ML IJ SUSP
12.0000 mg | Freq: Once | INTRAMUSCULAR | Status: AC
  Administered 2021-05-04: 12 mg

## 2021-05-04 NOTE — Progress Notes (Signed)
[name] - 40 y.o. female MRN [id number]  Date of birth: 14-Dec-1980  Office Visit Note: Visit Date: 05/04/2021 PCP: [place], Julie M, NP Referred by: [place], Julie M, NP  Subjective: Chief Complaint  Patient presents with  . Lower Back - Pain   HPI:  Amanda Mosley is a 40 y.o. female who comes in today for planned Right S1-2 Lumbar Transforaminal epidural steroid injection with fluoroscopic guidance.  The patient has failed conservative care including home exercise, medications, time and activity modification.  This injection will be diagnostic and hopefully therapeutic.  Please see requesting physician notes for further details and justification.   ROS Otherwise per HPI.  Assessment & Plan: Visit Diagnoses:    ICD-10-CM   1. Lumbar radiculopathy  M54.16 XR C-ARM NO REPORT    Epidural Steroid injection    betamethasone acetate-betamethasone sodium phosphate (CELESTONE) injection 12 mg    Plan: No additional findings.   Meds & Orders:  Meds ordered this encounter  Medications  . betamethasone acetate-betamethasone sodium phosphate (CELESTONE) injection 12 mg    Orders Placed This Encounter  Procedures  . XR C-ARM NO REPORT  . Epidural Steroid injection    Follow-up: Return if symptoms worsen or fail to improve.   Procedures: No procedures performed  S1 Lumbosacral Transforaminal Epidural Steroid Injection - Sub-Pedicular Approach with Fluoroscopic Guidance   Patient: [name]      Date of Birth: 05-19-1981 MRN: [id number] PCP: [place], Julie M, NP      Visit Date: 05/04/2021   Universal Protocol:    Date/Time: 05/27/225:58 AM  Consent Given By: the patient  Position:  PRONE  Additional Comments: Vital signs were monitored before and after the procedure. Patient was prepped and draped in the usual sterile fashion. The correct patient, procedure, and site was verified.   Injection Procedure Details:  Procedure Site One Meds Administered:  Meds  ordered this encounter  Medications  . betamethasone acetate-betamethasone sodium phosphate (CELESTONE) injection 12 mg    Laterality: Right  Location/Site:  S1 Foramen   Needle size: 22 ga.  Needle type: Spinal  Needle Placement: Transforaminal  Findings:   -Comments: Excellent flow of contrast along the nerve, nerve root and into the epidural space.  Epidurogram: Contrast epidurogram showed no nerve root cut off or restricted flow pattern.  Procedure Details: After squaring off the sacral end-plate to get a true AP view, the C-arm was positioned so that the best possible view of the S1 foramen was visualized. The soft tissues overlying this structure were infiltrated with 2-3 ml. of 1% Lidocaine without Epinephrine.    The spinal needle was inserted toward the target using a "trajectory" view along the fluoroscope beam.  Under AP and lateral visualization, the needle was advanced so it did not puncture dura. Biplanar projections were used to confirm position. Aspiration was confirmed to be negative for CSF and/or blood. A 1-2 ml. volume of Isovue-250 was injected and flow of contrast was noted at each level. Radiographs were obtained for documentation purposes.   After attaining the desired flow of contrast documented above, a 0.5 to 1.0 ml test dose of 0.25% Marcaine was injected into each respective transforaminal space.  The patient was observed for 90 seconds post injection.  After no sensory deficits were reported, and normal lower extremity motor function was noted,   the above injectate was administered so that equal amounts of the injectate were placed at each foramen (level) into the transforaminal epidural space.  Additional Comments:  The patient tolerated the procedure well Dressing: Band-Aid with 2 x 2 sterile gauze    Post-procedure details: Patient was observed during the procedure. Post-procedure instructions were reviewed.  Patient left the clinic in stable  condition.     Clinical History: Amanda Birch, MD - 06/07/2019  Formatting of this note might be different from the original.  MRI lumbar spine:   INDICATION: Back pain   TECHNIQUE: Sagittal and axial T1 and T2-weighted sequences were performed. Additional sagittal STIR images were performed.   COMPARISON: 10/06/2018   FINDINGS:   The patient is status post recent left L5 hemilaminectomy with postsurgical changes of the dorsal soft tissues including a small subcutaneous fluid collection which represent a hematoma measuring possibly 2.1 x 0.8 x 1.5 cm.   There is no acute fracture. There is no subluxation. There are diffuse degenerative changes with disc desiccation at the L4-5 and L5-S1 levels.   The T12-L1 through L2-3 levels are grossly unremarkable.   At the L3-4level there is no evidence of disc herniation or spinal canal stenosis. There is mild narrowing of the neural foramina by spondylitic change. There is fluid noted within the bilateral L3-4 facet joints.   At the L4-5 level there is a shallow central disc herniation with ligamental and facet joint hypertrophy with mild spinal canal stenosis and narrowing of the lateral recesses bilaterally. There is mild narrowing of the bilateral neural foramina by  spondylitic change.   At the L5-S1 level there is a left parasagittal soft tissue density indenting the ventral thecal sac which could represent postoperative changes versus residual disc herniation. There is no spinal canal stenosis. There is narrowing of the lateral  recesses left greater than right.There is mild narrowing of the bilateral neural foramina by spondylitic change.   There is no evidence to suggest epidural hematoma.   The visualized cauda equina demonstrates no evidence of arachnoiditis.    IMPRESSION:   Status post recent L5 left hemilaminectomy with postsurgical changes of the dorsal soft tissues.   No evidence of epidural hematoma. No  evidence of arachnoiditis.   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   Left parasagittal soft tissue density at L5-S1 which may represent postoperative changes versus residual disc herniation without spinal canal stenosis.   Electronically Signed by: Azzie Almas Exam End: 06/07/19 6:14 PM     Objective:  VS:  HT:    WT:   BMI:     BP:119/87  HR:79bpm  TEMP: ( )  RESP:  Physical Exam Vitals and nursing note reviewed.  Constitutional:      General: She is not in acute distress.    Appearance: Normal appearance. She is not ill-appearing.  HENT:     Head: Normocephalic and atraumatic.     Right Ear: External ear normal.     Left Ear: External ear normal.  Eyes:     Extraocular Movements: Extraocular movements intact.  Cardiovascular:     Rate and Rhythm: Normal rate.     Pulses: Normal pulses.  Pulmonary:     Effort: Pulmonary effort is normal. No respiratory distress.  Abdominal:     General: There is no distension.     Palpations: Abdomen is soft.  Musculoskeletal:        General: Tenderness present.     Cervical back: Neck supple.     Right lower leg: No edema.  Left lower leg: No edema.     Comments: Patient has good distal strength with no pain over the greater trochanters.  No clonus or focal weakness.  Skin:    Findings: No erythema, lesion or rash.  Neurological:     General: No focal deficit present.     Mental Status: She is alert and oriented to person, place, and time.     Sensory: No sensory deficit.     Motor: No weakness or abnormal muscle tone.     Coordination: Coordination normal.  Psychiatric:        Mood and Affect: Mood normal.        Behavior: Behavior normal.      Imaging: XR C-ARM NO REPORT  Result Date: 05/04/2021 Please see Notes tab for imaging impression.

## 2021-05-04 NOTE — Progress Notes (Signed)
Pt state lower back pain that ravels to her right buttocks. Pt state when laying down at night it so much pain she cant sleep at night. Pt state she takes over the counter pain meds and uses heating to help ease her pain.  Numeric Pain Rating Scale and Functional Assessment Average Pain 6   In the last MONTH (on 0-10 scale) has pain interfered with the following?  1. General activity like being  able to carry out your everyday physical activities such as walking, climbing stairs, carrying groceries, or moving a chair?  Rating(10)   +Driver, -BT, +[id number] Allergies.

## 2021-05-04 NOTE — Patient Instructions (Signed)

## 2021-05-05 NOTE — Procedures (Signed)
S1 Lumbosacral Transforaminal Epidural Steroid Injection - Sub-Pedicular Approach with Fluoroscopic Guidance   Patient: Amanda Mosley      Date of Birth: [birth year] MRN: [id number] PCP: [place], Julie M, NP      Visit Date: 05/04/2021   Universal Protocol:    Date/Time: 05/27/225:58 AM  Consent Given By: the patient  Position:  PRONE  Additional Comments: Vital signs were monitored before and after the procedure. Patient was prepped and draped in the usual sterile fashion. The correct patient, procedure, and site was verified.   Injection Procedure Details:  Procedure Site One Meds Administered:  Meds ordered this encounter  Medications  . betamethasone acetate-betamethasone sodium phosphate (CELESTONE) injection 12 mg    Laterality: Right  Location/Site:  S1 Foramen   Needle size: 22 ga.  Needle type: Spinal  Needle Placement: Transforaminal  Findings:   -Comments: Excellent flow of contrast along the nerve, nerve root and into the epidural space.  Epidurogram: Contrast epidurogram showed no nerve root cut off or restricted flow pattern.  Procedure Details: After squaring off the sacral end-plate to get a true AP view, the C-arm was positioned so that the best possible view of the S1 foramen was visualized. The soft tissues overlying this structure were infiltrated with 2-3 ml. of 1% Lidocaine without Epinephrine.    The spinal needle was inserted toward the target using a "trajectory" view along the fluoroscope beam.  Under AP and lateral visualization, the needle was advanced so it did not puncture dura. Biplanar projections were used to confirm position. Aspiration was confirmed to be negative for CSF and/or blood. A 1-2 ml. volume of Isovue-250 was injected and flow of contrast was noted at each level. Radiographs were obtained for documentation purposes.   After attaining the desired flow of contrast documented above, a 0.5 to 1.0 ml test dose of 0.25% Marcaine  was injected into each respective transforaminal space.  The patient was observed for 90 seconds post injection.  After no sensory deficits were reported, and normal lower extremity motor function was noted,   the above injectate was administered so that equal amounts of the injectate were placed at each foramen (level) into the transforaminal epidural space.   Additional Comments:  The patient tolerated the procedure well Dressing: Band-Aid with 2 x 2 sterile gauze    Post-procedure details: Patient was observed during the procedure. Post-procedure instructions were reviewed.  Patient left the clinic in stable condition.

## 2021-05-10 ENCOUNTER — Telehealth: Payer: Self-pay

## 2021-05-10 NOTE — Telephone Encounter (Signed)
patient called she stated she received a injection last week and the injection has not worked she is requesting a call back for other options:[phone]

## 2021-05-11 MED ORDER — PREGABALIN 75 MG PO CAPS: ORAL_CAPSULE | ORAL | 0 refills | Status: AC

## 2021-05-11 NOTE — Telephone Encounter (Signed)
Called patient to advise that prescription was sent to pharmacy. 

## 2021-05-11 NOTE — Telephone Encounter (Signed)
Patient reports no relief from right S1 TF on 5/26. She also asked about restarting Lyrica. Please advise.

## 2021-05-11 NOTE — Addendum Note (Signed)
Addended by: Ashok Norris on: 05/11/2021 09:07 AM   Modules accepted: Orders

## 2023-12-07 ENCOUNTER — Emergency Department (HOSPITAL_BASED_OUTPATIENT_CLINIC_OR_DEPARTMENT_OTHER)
Admission: EM | Admit: 2023-12-07 | Discharge: 2023-12-07 | Disposition: A | Discharge status: Home / Self Care | Payer: Medicaid Other | Attending: Emergency Medicine | Admitting: Emergency Medicine

## 2023-12-07 DIAGNOSIS — G43909 Migraine, unspecified, not intractable, without status migrainosus: Secondary | ICD-10-CM | POA: Insufficient documentation

## 2023-12-07 DIAGNOSIS — G43009 Migraine without aura, not intractable, without status migrainosus: Secondary | ICD-10-CM

## 2023-12-07 DIAGNOSIS — R519 Headache, unspecified: Secondary | ICD-10-CM | POA: Diagnosis present

## 2023-12-07 HISTORY — DX: Disorder of thyroid, unspecified: E07.9

## 2023-12-07 MED ORDER — ACETAMINOPHEN 500 MG PO TABS
1000.0000 mg | ORAL_TABLET | Freq: Once | ORAL | Status: AC
  Administered 2023-12-07: 1000 mg via ORAL
  Filled 2023-12-07: qty 2

## 2023-12-07 MED ORDER — PROCHLORPERAZINE MALEATE 10 MG PO TABS
10.0000 mg | ORAL_TABLET | Freq: Once | ORAL | Status: AC
  Administered 2023-12-07: 10 mg via ORAL
  Filled 2023-12-07: qty 1

## 2023-12-07 MED ORDER — KETOROLAC TROMETHAMINE 10 MG PO TABS
10.0000 mg | ORAL_TABLET | Freq: Once | ORAL | Status: AC
  Administered 2023-12-07: 10 mg via ORAL
  Filled 2023-12-07: qty 1

## 2023-12-07 MED ORDER — LORATADINE 10 MG PO TABS
10.0000 mg | ORAL_TABLET | Freq: Once | ORAL | Status: AC
  Administered 2023-12-07: 10 mg via ORAL
  Filled 2023-12-07: qty 1

## 2023-12-07 MED ORDER — DIPHENHYDRAMINE HCL 25 MG PO CAPS
25.0000 mg | ORAL_CAPSULE | Freq: Once | ORAL | Status: DC
  Filled 2023-12-07: qty 1

## 2023-12-09 ENCOUNTER — Encounter: Payer: Self-pay | Admitting: Physical Medicine and Rehabilitation
